# Patient Record
Sex: Female | Born: 1970 | Hispanic: No | Marital: Married | Smoking: Current every day smoker
Health system: Southern US, Community
[De-identification: ages and names within clinical notes are randomized; demographics above are authoritative.]

## PROBLEM LIST (undated history)

## (undated) DIAGNOSIS — M549 Dorsalgia, unspecified: Secondary | ICD-10-CM

---

## 1998-06-08 ENCOUNTER — Encounter: Payer: Self-pay | Admitting: Family Medicine

## 1998-06-08 ENCOUNTER — Ambulatory Visit (HOSPITAL_COMMUNITY): Admission: RE | Admit: 1998-06-08 | Discharge: 1998-06-08 | Payer: Self-pay | Admitting: Family Medicine

## 2011-11-15 ENCOUNTER — Encounter (HOSPITAL_BASED_OUTPATIENT_CLINIC_OR_DEPARTMENT_OTHER): Payer: Self-pay | Admitting: *Deleted

## 2011-11-15 ENCOUNTER — Emergency Department (HOSPITAL_BASED_OUTPATIENT_CLINIC_OR_DEPARTMENT_OTHER)
Admission: EM | Admit: 2011-11-15 | Discharge: 2011-11-15 | Disposition: A | Discharge status: Home / Self Care | Payer: Self-pay | Attending: Emergency Medicine | Admitting: Emergency Medicine

## 2011-11-15 ENCOUNTER — Emergency Department (INDEPENDENT_AMBULATORY_CARE_PROVIDER_SITE_OTHER): Payer: Self-pay

## 2011-11-15 DIAGNOSIS — M545 Low back pain: Secondary | ICD-10-CM

## 2011-11-15 DIAGNOSIS — M549 Dorsalgia, unspecified: Secondary | ICD-10-CM | POA: Insufficient documentation

## 2011-11-15 DIAGNOSIS — F17200 Nicotine dependence, unspecified, uncomplicated: Secondary | ICD-10-CM | POA: Insufficient documentation

## 2011-11-15 MED ORDER — HYDROCODONE-ACETAMINOPHEN 5-500 MG PO TABS: 1.0000 | ORAL_TABLET | Freq: Four times a day (QID) | ORAL | Status: AC | PRN

## 2011-11-15 MED ORDER — DIAZEPAM 5 MG PO TABS: 5.0000 mg | ORAL_TABLET | Freq: Three times a day (TID) | ORAL | Status: AC | PRN

## 2011-11-15 MED ORDER — IBUPROFEN 800 MG PO TABS
800.0000 mg | ORAL_TABLET | Freq: Once | ORAL | Status: AC
  Administered 2011-11-15: 800 mg via ORAL
  Filled 2011-11-15: qty 1

## 2011-11-15 MED ORDER — IBUPROFEN 800 MG PO TABS: 800.0000 mg | ORAL_TABLET | Freq: Three times a day (TID) | ORAL | Status: AC | PRN

## 2011-11-15 MED ORDER — DIAZEPAM 5 MG PO TABS
5.0000 mg | ORAL_TABLET | Freq: Once | ORAL | Status: AC
  Administered 2011-11-15: 5 mg via ORAL
  Filled 2011-11-15: qty 1

## 2011-11-15 NOTE — Discharge Instructions (Signed)
Back Pain, Adult Low back pain is very common. About 1 in 5 people have back pain.The cause of low back pain is rarely dangerous. The pain often gets better over time.About half of people with a sudden onset of back pain feel better in just 2 weeks. About 8 in 10 people feel better by 6 weeks.  CAUSES Some common causes of back pain include:  Strain of the muscles or ligaments supporting the spine.   Wear and tear (degeneration) of the spinal discs.   Arthritis.   Direct injury to the back.  DIAGNOSIS Most of the time, the direct cause of low back pain is not known.However, back pain can be treated effectively even when the exact cause of the pain is unknown.Answering your caregiver's questions about your overall health and symptoms is one of the most accurate ways to make sure the cause of your pain is not dangerous. If your caregiver needs more information, he or she may order lab work or imaging tests (X-rays or MRIs).However, even if imaging tests show changes in your back, this usually does not require surgery. HOME CARE INSTRUCTIONS For many people, back pain returns.Since low back pain is rarely dangerous, it is often a condition that people can learn to manageon their own.   Remain active. It is stressful on the back to sit or stand in one place. Do not sit, drive, or stand in one place for more than 30 minutes at a time. Take short walks on level surfaces as soon as pain allows.Try to increase the length of time you walk each day.   Do not stay in bed.Resting more than 1 or 2 days can delay your recovery.   Do not avoid exercise or work.Your body is made to move.It is not dangerous to be active, even though your back may hurt.Your back will likely heal faster if you return to being active before your pain is gone.   Pay attention to your body when you bend and lift. Many people have less discomfortwhen lifting if they bend their knees, keep the load close to their  bodies,and avoid twisting. Often, the most comfortable positions are those that put less stress on your recovering back.   Find a comfortable position to sleep. Use a firm mattress and lie on your side with your knees slightly bent. If you lie on your back, put a pillow under your knees.   Only take over-the-counter or prescription medicines as directed by your caregiver. Over-the-counter medicines to reduce pain and inflammation are often the most helpful.Your caregiver may prescribe muscle relaxant drugs.These medicines help dull your pain so you can more quickly return to your normal activities and healthy exercise.   Put ice on the injured area.   Put ice in a plastic bag.   Place a towel between your skin and the bag.   Leave the ice on for 15 to 20 minutes, 3 to 4 times a day for the first 2 to 3 days. After that, ice and heat may be alternated to reduce pain and spasms.   Ask your caregiver about trying back exercises and gentle massage. This may be of some benefit.   Avoid feeling anxious or stressed.Stress increases muscle tension and can worsen back pain.It is important to recognize when you are anxious or stressed and learn ways to manage it.Exercise is a great option.  SEEK MEDICAL CARE IF:  You have pain that is not relieved with rest or medicine.   You have   pain that does not improve in 1 week.   You have new symptoms.   You are generally not feeling well.  SEEK IMMEDIATE MEDICAL CARE IF:   You have pain that radiates from your back into your legs.   You develop new bowel or bladder control problems.   You have unusual weakness or numbness in your arms or legs.   You develop nausea or vomiting.   You develop abdominal pain.   You feel faint.  Document Released: 09/02/2005 Document Revised: 05/15/2011 Document Reviewed: 01/21/2011 [employer] Patient Information 2012 [employer].Back Exercises Back exercises help treat and prevent back injuries. The goal  of back exercises is to increase the strength of your abdominal and back muscles and the flexibility of your back. These exercises should be started when you no longer have back pain. Back exercises include:  Pelvic Tilt. Lie on your back with your knees bent. Tilt your pelvis until the lower part of your back is against the floor. Hold this position 5 to 10 sec and repeat 5 to 10 times.   Knee to Chest. Pull first 1 knee up against your chest and hold for 20 to 30 seconds, repeat this with the other knee, and then both knees. This may be done with the other leg straight or bent, whichever feels better.   Sit-Ups or Curl-Ups. Bend your knees 90 degrees. Start with tilting your pelvis, and do a partial, slow sit-up, lifting your trunk only 30 to 45 degrees off the floor. Take at least 2 to 3 seconds for each sit-up. Do not do sit-ups with your knees out straight. If partial sit-ups are difficult, simply do the above but with only tightening your abdominal muscles and holding it as directed.   Hip-Lift. Lie on your back with your knees flexed 90 degrees. Push down with your feet and shoulders as you raise your hips a couple inches off the floor; hold for 10 seconds, repeat 5 to 10 times.   Back arches. Lie on your stomach, propping yourself up on bent elbows. Slowly press on your hands, causing an arch in your low back. Repeat 3 to 5 times. Any initial stiffness and discomfort should lessen with repetition over time.   Shoulder-Lifts. Lie face down with arms beside your body. Keep hips and torso pressed to floor as you slowly lift your head and shoulders off the floor.  Do not overdo your exercises, especially in the beginning. Exercises may cause you some mild back discomfort which lasts for a few minutes; however, if the pain is more severe, or lasts for more than 15 minutes, do not continue exercises until you see your caregiver. Improvement with exercise therapy for back problems is slow.  See your  caregivers for assistance with developing a proper back exercise program. Document Released: 10/10/2004 Document Revised: 05/01/2011 Document Reviewed: 09/02/2005 [employer] Patient Information 2012 [employer]. 

## 2011-11-15 NOTE — ED Provider Notes (Signed)
History     CSN: [id number]  Arrival date & time 11/15/11  1239   First MD Initiated Contact with Patient 11/15/11 1254      Chief Complaint  Patient presents with  . Back Pain    (Consider location/radiation/quality/duration/timing/severity/associated sxs/prior treatment) HPI Patient is a 41 year old female who presents today complaining of lower back pain. She reports that around this time every year she has exacerbation of low back pain. She indicates that her pain is exactly in the midline at about the level of L3. She denies any history of injury to her back. She noted that her pain began yesterday. She can't recall a particular injury. Patient says the pain is sharp and a 8/10. It is worse with ambulation and certain movements it is better with rest. She has no associated urinary symptoms. Patient denies any urinary or fecal incontinence. She denies any sever anesthesia or other neurologic symptoms. Pain does not radiate. She has no history of osteoporosis or cancer. She has no history of heavy steroid use. Patient has tried Flexeril and diclofenac that she had from her last episode. She reports that this worked well previously for her but has not worked this time. Patient denies any nausea, vomiting, fevers, or other concerning symptoms. There are no other associated or modifying factors. History reviewed. No pertinent past medical history.  History reviewed. No pertinent past surgical history.  History reviewed. No pertinent family history.  History  Substance Use Topics  . Smoking status: Current Everyday Smoker -- 0.5 packs/day  . Smokeless tobacco: Not on file  . Alcohol Use: Yes    OB History    Grav Para Term Preterm Abortions TAB SAB Ect Mult Living                  Review of Systems  Constitutional: Negative.   HENT: Negative.   Eyes: Negative.   Respiratory: Negative.   Cardiovascular: Negative.   Gastrointestinal: Negative.   Genitourinary: Negative.     Musculoskeletal: Positive for back pain.  Skin: Negative.   Neurological: Negative.   Hematological: Negative.   Psychiatric/Behavioral: Negative.   All other systems reviewed and are negative.    Allergies  Review of patient's allergies indicates no known allergies.  Home Medications   Current Outpatient Rx  Name Route Sig Dispense Refill  . FLEXERIL PO Oral Take by mouth.    . VOLTAREN PO Oral Take by mouth.    Marland Kitchen DIAZEPAM 5 MG PO TABS Oral Take 1 tablet (5 mg total) by mouth every 8 (eight) hours as needed for anxiety. 30 tablet 0  . HYDROCODONE-ACETAMINOPHEN 5-500 MG PO TABS Oral Take 1-2 tablets by mouth every 6 (six) hours as needed for pain. 15 tablet 0  . IBUPROFEN 800 MG PO TABS Oral Take 1 tablet (800 mg total) by mouth every 8 (eight) hours as needed for pain. 30 tablet 0    BP 148/92  Pulse 91  Temp(Src) 98 F (36.7 C) (Oral)  Resp 14  SpO2 99%  LMP 11/08/2011  Physical Exam  Nursing note and vitals reviewed. GEN: Well-developed, well-nourished female in no distress HEENT: Atraumatic, normocephalic. Oropharynx clear without erythema EYES: PERRLA BL, no scleral icterus. NECK: Trachea midline, no meningismus CV: regular rate and rhythm. No murmurs, rubs, or gallops PULM: No respiratory distress.  No crackles, wheezes, or rales. GI: soft, non-tender. No guarding, rebound, or tenderness. + bowel sounds  Neuro: cranial nerves 2-12 intact, no abnormalities of strength or sensation, A and  O x 3 MSK: Patient moves all 4 extremities symmetrically, no deformity, edema. Patient has point tenderness at L3 in the midline. She has no other tenderness noted. Pain is made worse with twisting movements.  Psych: no abnormality of mood   ED Course  Procedures (including critical care time)  Labs Reviewed - No data to display Dg Lumbar Spine Complete  11/15/2011  *RADIOLOGY REPORT*  Clinical Data: Low back pain.  LUMBAR SPINE - COMPLETE 4+ VIEW  Comparison: None.  Findings:  There are five lumbar-type vertebral bodies.  No fracture or malalignment.  Disc spaces well maintained.  SI joints are symmetric.  IMPRESSION: Unremarkable study.  Original Report Authenticated By: Cyndie Chime, M.D.     1. Back pain       MDM  Patient was evaluated by myself. Based on evaluation patient did have a plain film performed as her pain was distinctly in the midline. Patient had a history of smoking but no other concerning risk factors for a bone lesion. She was hemodynamically stable with no neurologic signs. Plain film returned negative. At treated patient conservatively here with Valium as well as ibuprofen for symptoms. Patient had some improvement. As Flexeril was no longer working for her we did give her prescription for Valium as well as ibuprofen. Patient was also given a few tabs of Vicodin as she is unable to sleep at night. The progression of how these medications are to be used as discussed. Patient was discharged in good condition with work note.        Cyndra Numbers, MD 11/15/11 [phone]

## 2011-11-15 NOTE — ED Notes (Signed)
Woke yesterday with lower back pain. Thinks she pulled a muscle. Pain with ambulation.

## 2012-07-02 ENCOUNTER — Emergency Department (HOSPITAL_BASED_OUTPATIENT_CLINIC_OR_DEPARTMENT_OTHER)
Admission: EM | Admit: 2012-07-02 | Discharge: 2012-07-02 | Disposition: A | Discharge status: Home / Self Care | Payer: Self-pay | Attending: Emergency Medicine | Admitting: Emergency Medicine

## 2012-07-02 ENCOUNTER — Encounter (HOSPITAL_BASED_OUTPATIENT_CLINIC_OR_DEPARTMENT_OTHER): Payer: Self-pay | Admitting: *Deleted

## 2012-07-02 DIAGNOSIS — M545 Low back pain, unspecified: Secondary | ICD-10-CM | POA: Insufficient documentation

## 2012-07-02 DIAGNOSIS — F17200 Nicotine dependence, unspecified, uncomplicated: Secondary | ICD-10-CM | POA: Insufficient documentation

## 2012-07-02 MED ORDER — DICLOFENAC SODIUM 75 MG PO TBEC: 75.0000 mg | DELAYED_RELEASE_TABLET | Freq: Two times a day (BID) | ORAL | Status: AC

## 2012-07-02 MED ORDER — DICLOFENAC SODIUM 75 MG PO TBEC
75.0000 mg | DELAYED_RELEASE_TABLET | Freq: Once | ORAL | Status: DC
  Filled 2012-07-02: qty 1

## 2012-07-02 MED ORDER — BUPIVACAINE HCL (PF) 0.5 % IJ SOLN
10.0000 mL | Freq: Once | INTRAMUSCULAR | Status: AC
Start: 2012-07-02 — End: 2012-07-02
  Administered 2012-07-02: 10 mL
  Filled 2012-07-02: qty 10

## 2012-07-02 MED ORDER — KETOROLAC TROMETHAMINE 30 MG/ML IJ SOLN: 30.0000 mg | Freq: Once | INTRAMUSCULAR | Status: DC

## 2012-07-02 MED ORDER — CYCLOBENZAPRINE HCL 10 MG PO TABS
10.0000 mg | ORAL_TABLET | Freq: Once | ORAL | Status: AC
  Administered 2012-07-02: 10 mg via ORAL
  Filled 2012-07-02: qty 1

## 2012-07-02 MED ORDER — CYCLOBENZAPRINE HCL 10 MG PO TABS: 10.0000 mg | ORAL_TABLET | Freq: Two times a day (BID) | ORAL | Status: AC | PRN

## 2012-07-02 MED ORDER — KETOROLAC TROMETHAMINE 30 MG/ML IJ SOLN
30.0000 mg | Freq: Once | INTRAMUSCULAR | Status: AC
  Administered 2012-07-02: 30 mg via INTRAMUSCULAR
  Filled 2012-07-02: qty 1

## 2012-07-02 NOTE — ED Provider Notes (Signed)
History     CSN: [id number]  Arrival date & time 07/02/12  1237   First MD Initiated Contact with Patient 07/02/12 1321      Chief Complaint  Patient presents with  . 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 This started Friday as a "pinch" and was very minor pain however has progressed today to the point of severe pain and spasm. Pain is in the right lower lumbar region, associated with chills to the right hamstring, and into the right but off. She has no numbness or tingling in the right leg, no loss of bowel or bladder function. She's been taking ibuprofen with some improvement since Friday. She denies any IV drug abuse, no fevers or chills, no weight loss.  She has had pain similar to this in the past last March when she was treated for a similar condition. The pain has not kept her awake at night.  History reviewed. No pertinent past medical history.  History reviewed. No pertinent past surgical history.  No family history on file.  History  Substance Use Topics  . Smoking status: Current Every Day Smoker -- 0.5 packs/day    Types: Cigarettes  . Smokeless tobacco: Not on file  . Alcohol Use: Yes    OB History    Grav Para Term Preterm Abortions TAB SAB Ect Mult Living                  Review of Systems At least 10pt or greater review of systems completed and are negative except where specified in the HPI.  Allergies  Review of patient's allergies indicates no known allergies.  Home Medications   Current Outpatient Rx  Name Route Sig Dispense Refill  . FLEXERIL PO Oral Take by mouth.    . VOLTAREN PO Oral Take by mouth.      BP 144/93  Pulse 80  Temp 98.3 F (36.8 C) (Oral)  Resp 18  Ht 5\' 3"  (1.6 m)  Wt 140 lb (63.504 kg)  BMI 24.80 kg/m2   SpO2 100%  LMP 06/22/2012  Physical Exam  Nursing notes reviewed.  Electronic medical record reviewed. VITAL SIGNS:   Filed Vitals:   07/02/12 1252  BP: 144/93  Pulse: 80  Temp: 98.3 F (36.8 C)  TempSrc: Oral  Resp: 18  Height: 5\' 3"  (1.6 m)  Weight: 140 lb (63.504 kg)  SpO2: 100%   CONSTITUTIONAL: Awake, oriented, appears non-toxic HENT: Atraumatic, normocephalic, oral mucosa pink and moist, airway patent. Nares patent without drainage. External ears normal. EYES: Conjunctiva clear, EOMI, PERRLA NECK: Trachea midline, non-tender, supple CARDIOVASCULAR: Normal heart rate, Normal rhythm, No murmurs, rubs, gallops PULMONARY/CHEST: Clear to auscultation, no rhonchi, wheezes, or rales. Symmetrical breath sounds. Non-tender. ABDOMINAL: Non-distended, soft, non-tender - no rebound or guarding.  BS normal. NEUROLOGIC: Non-focal, moving all four extremities, no gross sensory or motor deficits. BACK: No midline spinal tenderness. Patient has palpable spasm in the right lower lumbar region L3-L5 and is tender to palpation at the iliac crest and into the buttock. Straight leg test  EXTREMITIES: No clubbing, cyanosis, or edema SKIN: Warm, Dry, No erythema, No rash  ED Course  Procedures (including critical care time)  Labs Reviewed - No data to display No results found.   1. Low back pain  MDM  Nichole Barton is a 41 y.o. female presenting with recurrent low back pain. Do not think there is any indication to image this patient at this time. She had x-rays performed in March which were unremarkable. 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 Patient has no signs symptoms consistent with a cord compression syndrome. We'll treat her conservatively with oral anti-inflammatories and muscle relaxers to control her muscle spasm and pain.   I explained the diagnosis and have given explicit precautions to return to the ER  including numbness tingling, weakness, perianal anesthesia or bowel or bladder incontinence or any other new or worsening symptoms. The patient understands and accepts the medical plan as it's been dictated and I have answered their questions. Discharge instructions concerning home care and prescriptions have been given.  The patient is STABLE and is discharged to home in good condition.        Jones Skene, MD 07/03/12 [phone]

## 2012-07-02 NOTE — ED Notes (Signed)
Patient states she developed mid lower back pain one week ago.  No known injury.  States pain has progressively gotten worse, describes as burning and is radiating into right buttock and lateral right leg to the knee.

## 2012-08-06 ENCOUNTER — Emergency Department (HOSPITAL_BASED_OUTPATIENT_CLINIC_OR_DEPARTMENT_OTHER)
Admission: EM | Admit: 2012-08-06 | Discharge: 2012-08-06 | Disposition: A | Discharge status: Home / Self Care | Payer: Self-pay | Attending: Emergency Medicine | Admitting: Emergency Medicine

## 2012-08-06 ENCOUNTER — Encounter (HOSPITAL_BASED_OUTPATIENT_CLINIC_OR_DEPARTMENT_OTHER): Payer: Self-pay | Admitting: *Deleted

## 2012-08-06 DIAGNOSIS — Z79899 Other long term (current) drug therapy: Secondary | ICD-10-CM | POA: Insufficient documentation

## 2012-08-06 DIAGNOSIS — F17200 Nicotine dependence, unspecified, uncomplicated: Secondary | ICD-10-CM | POA: Insufficient documentation

## 2012-08-06 DIAGNOSIS — M545 Low back pain: Secondary | ICD-10-CM

## 2012-08-06 DIAGNOSIS — M5430 Sciatica, unspecified side: Secondary | ICD-10-CM | POA: Insufficient documentation

## 2012-08-06 HISTORY — DX: Dorsalgia, unspecified: M54.9

## 2012-08-06 MED ORDER — PREDNISONE 10 MG PO TABS: ORAL_TABLET | ORAL | Status: AC

## 2012-08-06 MED ORDER — HYDROCODONE-ACETAMINOPHEN 5-325 MG PO TABS: 2.0000 | ORAL_TABLET | ORAL | Status: AC | PRN

## 2012-08-06 NOTE — ED Provider Notes (Signed)
History     CSN: [id number]  Arrival date & time 08/06/12  1135   First MD Initiated Contact with Patient 08/06/12 1222      Chief Complaint  Patient presents with  . Back Pain    (Consider location/radiation/quality/duration/timing/severity/associated sxs/prior treatment) Patient is a 41 y.o. female presenting with back pain. The history is provided by the patient. No language interpreter was used.  Back Pain  This is a new problem. Episode onset: 3 days. The problem occurs constantly. The problem has been gradually worsening. The pain is associated with no known injury. The pain is present in the lumbar spine. The quality of the pain is described as aching. The pain radiates to the right thigh. The pain is moderate. The pain is worse during the day. Stiffness is present all day. She has tried nothing for the symptoms. The treatment provided no relief.  Pt complains of low back pain that radiates down her left leg  Past Medical History  Diagnosis Date  . Back pain     History reviewed. No pertinent past surgical history.  No family history on file.  History  Substance Use Topics  . Smoking status: Current Every Day Smoker -- 0.5 packs/day    Types: Cigarettes  . Smokeless tobacco: Not on file  . Alcohol Use: Yes    OB History    Grav Para Term Preterm Abortions TAB SAB Ect Mult Living                  Review of Systems  Musculoskeletal: Positive for back pain.  All other systems reviewed and are negative.    Allergies  Review of patient's allergies indicates no known allergies.  Home Medications   Current Outpatient Rx  Name  Route  Sig  Dispense  Refill  . IBUPROFEN 800 MG PO TABS   Oral   Take 800 mg by mouth every 8 (eight) hours as needed.         . CYCLOBENZAPRINE HCL 10 MG PO TABS   Oral   Take 1 tablet (10 mg total) by mouth 2 (two) times daily as needed for muscle spasms.   20 tablet   0   . FLEXERIL PO   Oral   Take by mouth.         . DICLOFENAC SODIUM 75 MG PO TBEC   Oral   Take 1 tablet (75 mg total) by mouth 2 (two) times daily.   15 tablet   0   . VOLTAREN PO   Oral   Take by mouth.           BP 148/96  Pulse 91  Temp 98.9 F (37.2 C) (Oral)  Resp 16  Ht 5\' 2"  (1.575 m)  Wt 135 lb (61.236 kg)  BMI 24.69 kg/m2  SpO2 100%  LMP 08/05/2012  Physical Exam  Nursing note and vitals reviewed. Constitutional: She is oriented to person, place, and time. She appears well-developed and well-nourished.  HENT:  Head: Normocephalic and atraumatic.  Eyes: Pupils are equal, round, and reactive to light.  Neck: Normal range of motion. Neck supple.  Cardiovascular: Normal rate and normal heart sounds.   Pulmonary/Chest: Effort normal and breath sounds normal.  Musculoskeletal: Normal range of motion.       Tender ls spine,  Tender down into left buttock  Neurological: She is alert and oriented to person, place, and time.  Skin: Skin is warm.  Psychiatric: She has a normal mood and  affect.    ED Course  Procedures (including critical care time)  Labs Reviewed - No data to display No results found.   No diagnosis found.    MDM  Pt given rx for prednisone and hydrocodone.  Pt advised to follow up with Dr. Pearletha Forge for recheck        Elson Areas, [place] 08/06/12 1302

## 2012-08-06 NOTE — ED Provider Notes (Signed)
Medical screening examination/treatment/procedure(s) were performed by non-physician practitioner and as supervising physician I was immediately available for consultation/collaboration.   Jaisean Monteforte, MD 08/06/12 1526 

## 2012-08-06 NOTE — ED Notes (Signed)
Patient states she developed low back pain radiating into her buttocks for the last 3 days.  No known injury.  Hx of same.  Using OTC ibuprofen with minimal relief.

## 2012-11-03 ENCOUNTER — Encounter (HOSPITAL_BASED_OUTPATIENT_CLINIC_OR_DEPARTMENT_OTHER): Payer: Self-pay | Admitting: *Deleted

## 2012-11-03 ENCOUNTER — Emergency Department (HOSPITAL_BASED_OUTPATIENT_CLINIC_OR_DEPARTMENT_OTHER)
Admission: EM | Admit: 2012-11-03 | Discharge: 2012-11-03 | Disposition: A | Discharge status: Home / Self Care | Payer: Self-pay | Attending: Emergency Medicine | Admitting: Emergency Medicine

## 2012-11-03 DIAGNOSIS — R059 Cough, unspecified: Secondary | ICD-10-CM | POA: Insufficient documentation

## 2012-11-03 DIAGNOSIS — Z3202 Encounter for pregnancy test, result negative: Secondary | ICD-10-CM | POA: Insufficient documentation

## 2012-11-03 DIAGNOSIS — F17200 Nicotine dependence, unspecified, uncomplicated: Secondary | ICD-10-CM | POA: Insufficient documentation

## 2012-11-03 DIAGNOSIS — Z8739 Personal history of other diseases of the musculoskeletal system and connective tissue: Secondary | ICD-10-CM | POA: Insufficient documentation

## 2012-11-03 DIAGNOSIS — R05 Cough: Secondary | ICD-10-CM | POA: Insufficient documentation

## 2012-11-03 DIAGNOSIS — IMO0001 Reserved for inherently not codable concepts without codable children: Secondary | ICD-10-CM | POA: Insufficient documentation

## 2012-11-03 DIAGNOSIS — J029 Acute pharyngitis, unspecified: Secondary | ICD-10-CM | POA: Insufficient documentation

## 2012-11-03 DIAGNOSIS — J111 Influenza due to unidentified influenza virus with other respiratory manifestations: Secondary | ICD-10-CM | POA: Insufficient documentation

## 2012-11-03 LAB — URINE MICROSCOPIC-ADD ON

## 2012-11-03 LAB — URINALYSIS, ROUTINE W REFLEX MICROSCOPIC
Leukocytes, UA: NEGATIVE
Nitrite: NEGATIVE
Protein, ur: NEGATIVE mg/dL
Specific Gravity, Urine: 1.017 (ref 1.005–1.030)
Urobilinogen, UA: 1 mg/dL (ref 0.0–1.0)

## 2012-11-03 LAB — PREGNANCY, URINE: Preg Test, Ur: NEGATIVE

## 2012-11-03 LAB — RAPID STREP SCREEN (MED CTR MEBANE ONLY): Streptococcus, Group A Screen (Direct): NEGATIVE

## 2012-11-03 MED ORDER — ACETAMINOPHEN 325 MG PO TABS
650.0000 mg | ORAL_TABLET | Freq: Once | ORAL | Status: AC
  Administered 2012-11-03: 650 mg via ORAL
  Filled 2012-11-03: qty 2

## 2012-11-03 NOTE — ED Notes (Signed)
Fever x 2 days. Sore throat and cough.

## 2012-11-03 NOTE — ED Provider Notes (Signed)
History     CSN: [id number]  Arrival date & time 11/03/12  1648   First MD Initiated Contact with Patient 11/03/12 1700      Chief Complaint  Patient presents with  . Fever    (Consider location/radiation/quality/duration/timing/severity/associated sxs/prior treatment) Patient is a 42 y.o. female presenting with fever. The history is provided by the patient.  Fever Max temp prior to arrival:  103 Temp source:  Oral Severity:  Moderate Onset quality:  Gradual Duration:  2 days Timing:  Constant Progression:  Worsening Chronicity:  New Relieved by:  Acetaminophen Worsened by:  Nothing tried Associated symptoms: chills, cough, myalgias and sore throat   Associated symptoms: no chest pain, no diarrhea, no nausea, no rhinorrhea and no vomiting   Risk factors: sick contacts   Risk factors: no hx of cancer and no immunosuppression     Past Medical History  Diagnosis Date  . Back pain     History reviewed. No pertinent past surgical history.  No family history on file.  History  Substance Use Topics  . Smoking status: Current Every Day Smoker -- 0.50 packs/day    Types: Cigarettes  . Smokeless tobacco: Not on file  . Alcohol Use: Yes    OB History   Grav Para Term Preterm Abortions TAB SAB Ect Mult Living                  Review of Systems  Constitutional: Positive for fever and chills.  HENT: Positive for sore throat. Negative for rhinorrhea.   Respiratory: Positive for cough.   Cardiovascular: Negative for chest pain.  Gastrointestinal: Negative for nausea, vomiting and diarrhea.  Musculoskeletal: Positive for myalgias.  All other systems reviewed and are negative.    Allergies  Review of patient's allergies indicates no known allergies.  Home Medications   Current Outpatient Rx  Name  Route  Sig  Dispense  Refill  . cyclobenzaprine (FLEXERIL) 10 MG tablet   Oral   Take 1 tablet (10 mg total) by mouth 2 (two) times daily as needed for muscle  spasms.   20 tablet   0   . Cyclobenzaprine HCl (FLEXERIL PO)   Oral   Take by mouth.         . diclofenac (VOLTAREN) 75 MG EC tablet   Oral   Take 1 tablet (75 mg total) by mouth 2 (two) times daily.   15 tablet   0   . Diclofenac Sodium (VOLTAREN PO)   Oral   Take by mouth.         Marland Kitchen HYDROcodone-acetaminophen (NORCO/VICODIN) 5-325 MG per tablet   Oral   Take 2 tablets by mouth every 4 (four) hours as needed for pain.   10 tablet   0   . ibuprofen (ADVIL,MOTRIN) 800 MG tablet   Oral   Take 800 mg by mouth every 8 (eight) hours as needed.         . predniSONE (DELTASONE) 10 MG tablet      6,5,4,3,2,1 taper   21 tablet   0     BP 114/81  Pulse 120  Temp(Src) 100.4 F (38 C) (Oral)  Resp 22  SpO2 98%  LMP 10/28/2012  Physical Exam  Nursing note and vitals reviewed. Constitutional: She is oriented to person, place, and time. She appears well-developed and well-nourished. No distress.  HENT:  Head: Normocephalic and atraumatic.  Mouth/Throat: Mucous membranes are normal. Posterior oropharyngeal erythema present. No oropharyngeal exudate or posterior oropharyngeal edema.  Eyes: Conjunctivae and EOM are normal. Pupils are equal, round, and reactive to light.  Neck: Normal range of motion. Neck supple.  Cardiovascular: Regular rhythm and intact distal pulses.  Tachycardia present.   No murmur heard. Pulmonary/Chest: Effort normal and breath sounds normal. No respiratory distress. She has no wheezes. She has no rales.  Abdominal: Soft. She exhibits no distension. There is no tenderness. There is no rebound and no guarding.  Musculoskeletal: Normal range of motion. She exhibits no edema and no tenderness.  Lymphadenopathy:    She has cervical adenopathy.  Neurological: She is alert and oriented to person, place, and time.  Skin: Skin is warm and dry. No rash noted. No erythema.  Psychiatric: She has a normal mood and affect. Her behavior is normal.    ED  Course  Procedures (including critical care time)  Labs Reviewed  URINALYSIS, ROUTINE W REFLEX MICROSCOPIC - Abnormal; Notable for the following:    APPearance CLOUDY (*)    Hgb urine dipstick MODERATE (*)    All other components within normal limits  URINE MICROSCOPIC-ADD ON - Abnormal; Notable for the following:    Squamous Epithelial / LPF FEW (*)    All other components within normal limits  RAPID STREP SCREEN  PREGNANCY, URINE   No results found.   No diagnosis found.    MDM   Pt with symptoms consistent with influenza.  Normal exam here but is febrile.  No signs of breathing difficulty  No signs of strep pharyngitis, otitis or abnormal abdominal findings.   rapid strep wnl.  Will continue antipyretica and rest and fluids and return for any further problems.         Gwyneth Sprout, MD 11/03/12 [phone]

## 2014-02-27 IMAGING — CR DG LUMBAR SPINE COMPLETE 4+V
5 series · 5 of 5 positions shown · non-contrast
Comparison: None.

CLINICAL DATA: Low back pain.

LUMBAR SPINE - COMPLETE 4+ VIEW

[t l-spine a.p.]
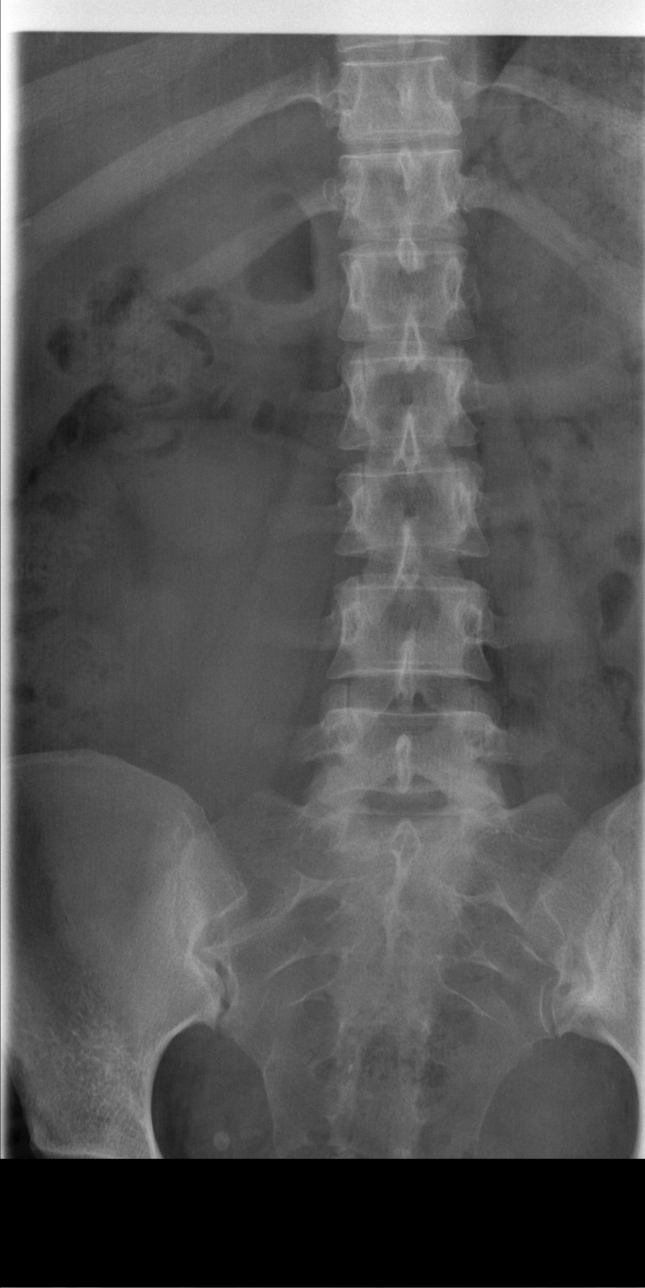

[t l-spine oblique exposure (1 of 2)]
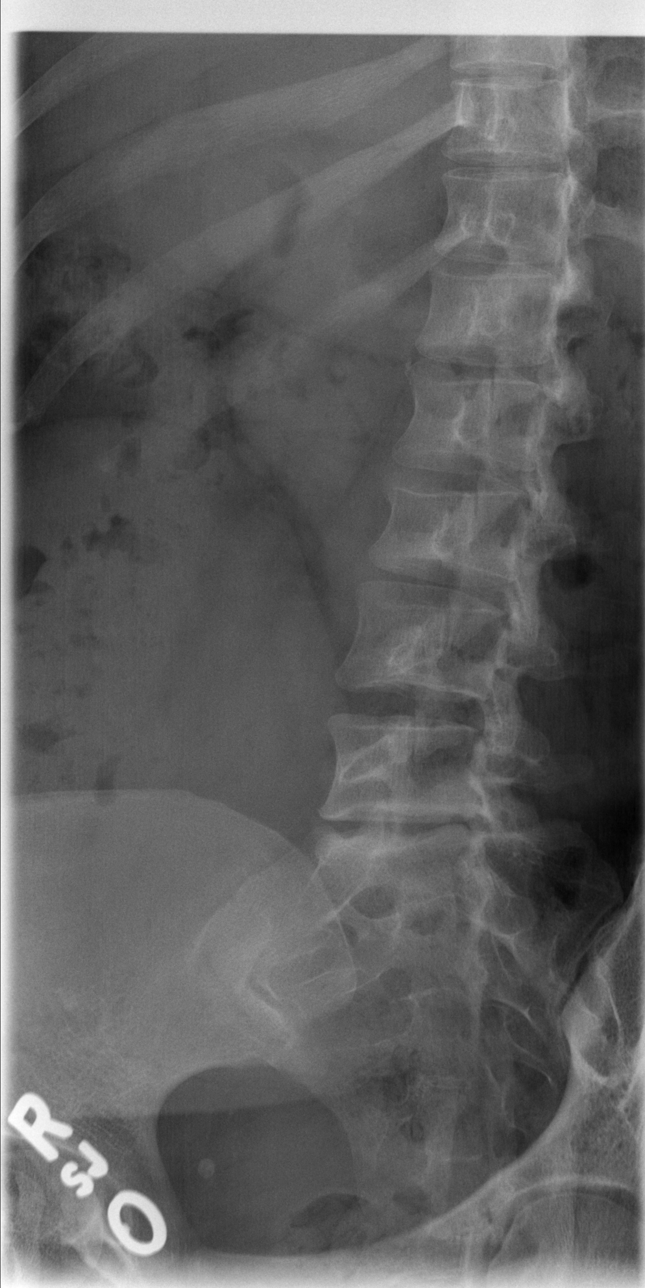

[t l-spine oblique exposure (2 of 2)]
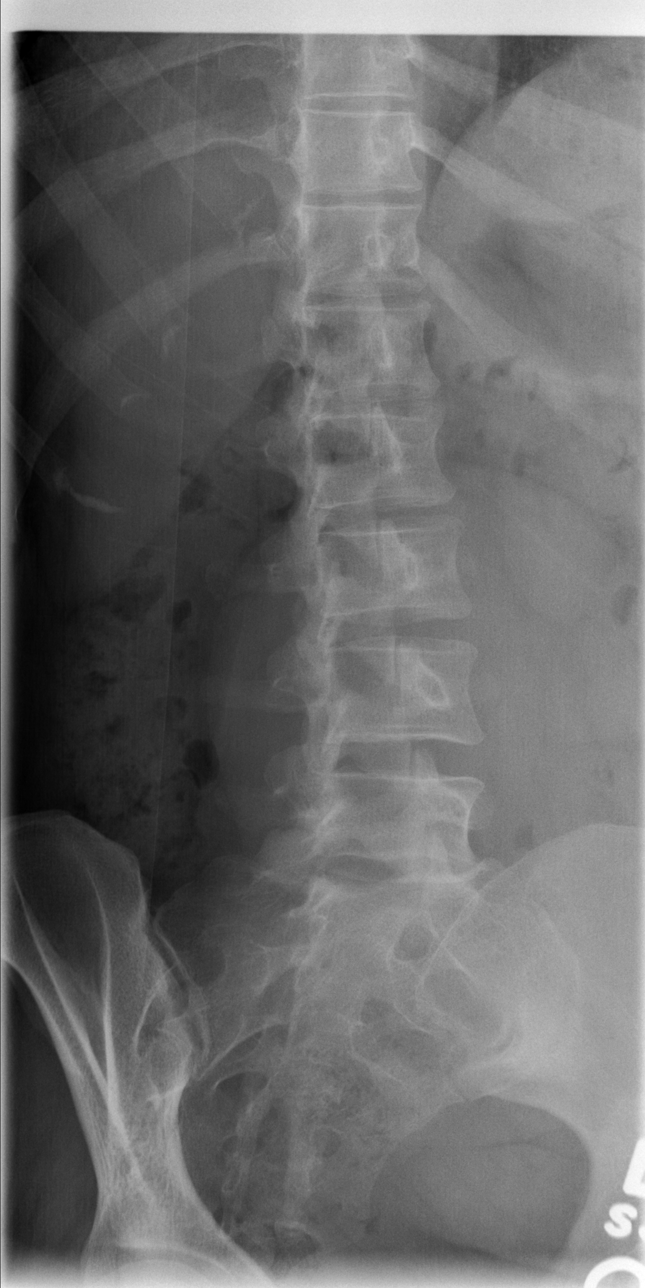

[t l-spine lat]
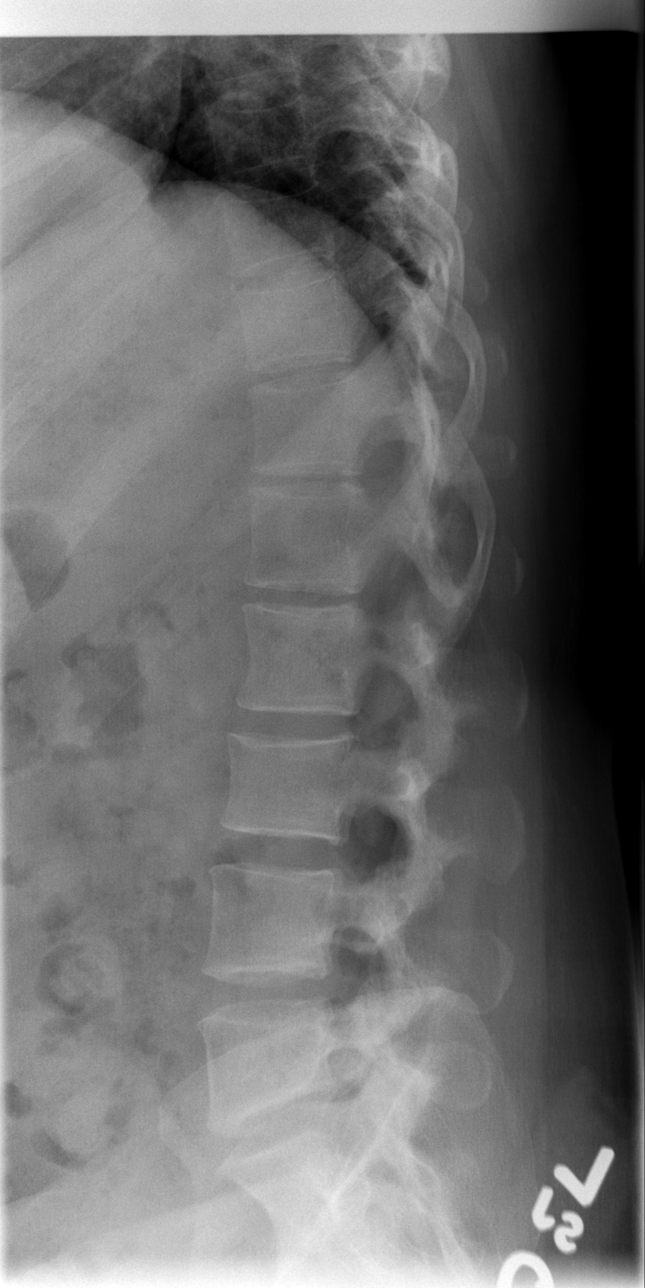

[t l-spine l5-s1 spot]
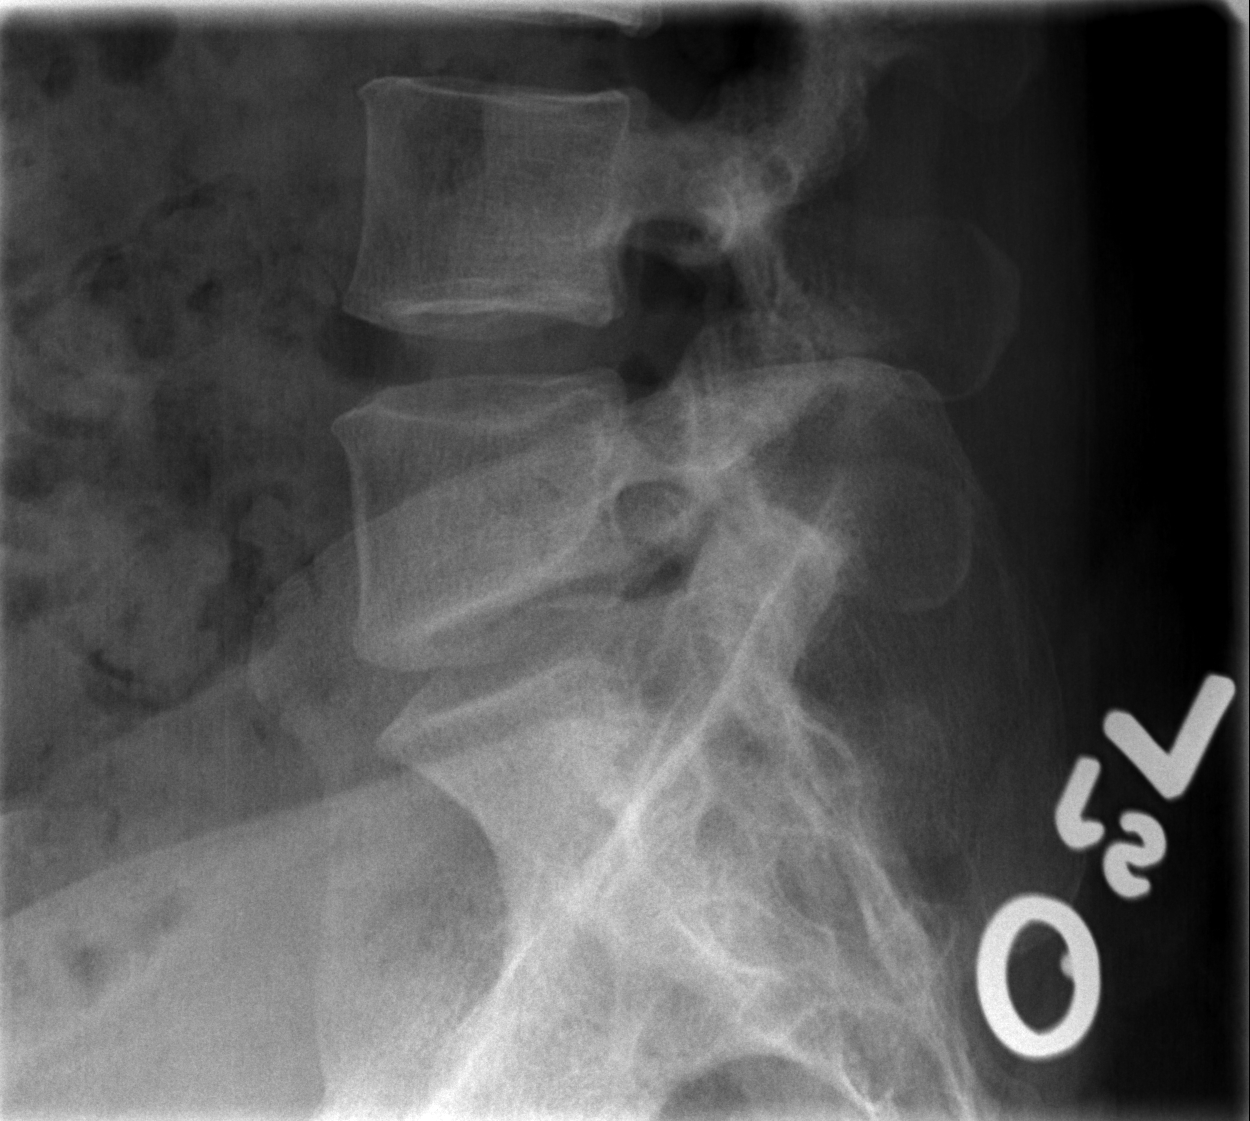

[5 of 5 positions shown; findings below may reference images not displayed]

FINDINGS: There are five lumbar-type vertebral bodies.  No fracture
or malalignment.  Disc spaces well maintained.  SI joints are
symmetric.
IMPRESSION: Unremarkable study.

## 2016-01-28 ENCOUNTER — Encounter (HOSPITAL_BASED_OUTPATIENT_CLINIC_OR_DEPARTMENT_OTHER): Payer: Self-pay | Admitting: Emergency Medicine

## 2016-01-28 ENCOUNTER — Emergency Department (HOSPITAL_BASED_OUTPATIENT_CLINIC_OR_DEPARTMENT_OTHER)
Admission: EM | Admit: 2016-01-28 | Discharge: 2016-01-28 | Disposition: A | Discharge status: Home / Self Care | Payer: BLUE CROSS/BLUE SHIELD | Attending: Emergency Medicine | Admitting: Emergency Medicine

## 2016-01-28 DIAGNOSIS — K0889 Other specified disorders of teeth and supporting structures: Secondary | ICD-10-CM

## 2016-01-28 DIAGNOSIS — F17210 Nicotine dependence, cigarettes, uncomplicated: Secondary | ICD-10-CM | POA: Diagnosis not present

## 2016-01-28 MED ORDER — IBUPROFEN 800 MG PO TABS: 800.0000 mg | ORAL_TABLET | Freq: Three times a day (TID) | ORAL | Status: AC | PRN

## 2016-01-28 MED ORDER — PENICILLIN V POTASSIUM 500 MG PO TABS: 500.0000 mg | ORAL_TABLET | Freq: Four times a day (QID) | ORAL | Status: AC

## 2016-01-28 MED ORDER — BUPIVACAINE-EPINEPHRINE (PF) 0.5% -1:200000 IJ SOLN
1.8000 mL | Freq: Once | INTRAMUSCULAR | Status: AC
  Administered 2016-01-28: 1.8 mL
  Filled 2016-01-28: qty 1.8

## 2016-01-28 MED ORDER — ACETAMINOPHEN 500 MG PO TABS: 1000.0000 mg | ORAL_TABLET | Freq: Four times a day (QID) | ORAL | Status: AC | PRN

## 2016-01-28 MED ORDER — OXYCODONE HCL 5 MG PO TABS
5.0000 mg | ORAL_TABLET | Freq: Once | ORAL | Status: AC
  Administered 2016-01-28: 5 mg via ORAL
  Filled 2016-01-28: qty 1

## 2016-01-28 MED ORDER — PENICILLIN V POTASSIUM 250 MG PO TABS
250.0000 mg | ORAL_TABLET | Freq: Once | ORAL | Status: AC
  Administered 2016-01-28: 250 mg via ORAL
  Filled 2016-01-28: qty 1

## 2016-01-28 MED ORDER — IBUPROFEN 800 MG PO TABS
800.0000 mg | ORAL_TABLET | Freq: Once | ORAL | Status: AC
  Administered 2016-01-28: 800 mg via ORAL
  Filled 2016-01-28: qty 1

## 2016-01-28 NOTE — ED Notes (Signed)
Pt in with RL dental pain and swelling onset 2 days ago. Has dental appt May 24.

## 2016-01-28 NOTE — ED Notes (Signed)
Pt took 1g Tylenol PTA.

## 2016-01-28 NOTE — ED Provider Notes (Signed)
CSN: [id number]     Arrival date & time 01/28/16  1633 History  By signing my name below, I, Bethel Born, attest that this documentation has been prepared under the direction and in the presence of Melene Plan, DO. Electronically Signed: Bethel Born, ED Scribe. 01/28/2016. 6:15 PM   Chief Complaint  Patient presents with  . Dental Pain   The history is provided by the patient. No language interpreter was used.   Nichole Barton is a 45 y.o. female who presents to the Emergency Department complaining of worsening right lower dental pain with onset 2 days ago.b Pt states that she had the same pain 2 months ago but it resolved. Tylenol has provided insufficient pain relief at home. The pain is worse with brushing and chewing on the right side.  Associated symptoms include right sided facial swelling. Pt denies fever, difficulty swallowing, nausea, vomiting. She has a scheduled dental appointment on 02/07/16.   Past Medical History  Diagnosis Date  . Back pain    History reviewed. No pertinent past surgical history. History reviewed. No pertinent family history. Social History  Substance Use Topics  . Smoking status: Current Every Day Smoker -- 0.50 packs/day    Types: Cigarettes  . Smokeless tobacco: None  . Alcohol Use: Yes   OB History    No data available     Review of Systems  Constitutional: Negative for fever and chills.  HENT: Positive for dental problem and facial swelling. Negative for congestion, drooling, rhinorrhea and trouble swallowing.   Eyes: Negative for redness and visual disturbance.  Respiratory: Negative for shortness of breath and wheezing.   Cardiovascular: Negative for chest pain and palpitations.  Gastrointestinal: Negative for nausea and vomiting.  Genitourinary: Negative for dysuria and urgency.  Musculoskeletal: Negative for myalgias and arthralgias.  Skin: Negative for pallor and wound.  Neurological: Negative for dizziness and headaches.  All  other systems reviewed and are negative.  Allergies  Review of patient's allergies indicates no known allergies.  Home Medications   Prior to Admission medications   Medication Sig Start Date End Date Taking? Authorizing Provider  acetaminophen (TYLENOL) 500 MG tablet Take 2 tablets (1,000 mg total) by mouth every 6 (six) hours as needed for moderate pain. 01/28/16   Melene Plan, DO  cyclobenzaprine (FLEXERIL) 10 MG tablet Take 1 tablet (10 mg total) by mouth 2 (two) times daily as needed for muscle spasms. 07/02/12   John-Adam Bonk, MD  Cyclobenzaprine HCl (FLEXERIL PO) Take by mouth.    Historical Provider, MD  diclofenac (VOLTAREN) 75 MG EC tablet Take 1 tablet (75 mg total) by mouth 2 (two) times daily. 07/02/12   John-Adam Bonk, MD  Diclofenac Sodium (VOLTAREN PO) Take by mouth.    Historical Provider, MD  HYDROcodone-acetaminophen (NORCO/VICODIN) 5-325 MG per tablet Take 2 tablets by mouth every 4 (four) hours as needed for pain. 08/06/12   Elson Areas, PA-C  ibuprofen (ADVIL,MOTRIN) 800 MG tablet Take 800 mg by mouth every 8 (eight) hours as needed.    Historical Provider, MD  ibuprofen (ADVIL,MOTRIN) 800 MG tablet Take 1 tablet (800 mg total) by mouth every 8 (eight) hours as needed for moderate pain. 01/28/16   Melene Plan, DO  penicillin v potassium (VEETID) 500 MG tablet Take 1 tablet (500 mg total) by mouth 4 (four) times daily. 01/28/16 02/04/16  Melene Plan, DO  predniSONE (DELTASONE) 10 MG tablet 6,5,4,3,2,1 taper 08/06/12   Elson Areas, PA-C   BP 156/105 mmHg  Pulse 86  Temp(Src) 98.1 F (36.7 C) (Oral)  Resp 16  Ht 5\' 3"  (1.6 m)  Wt 135 lb (61.236 kg)  BMI 23.92 kg/m2  SpO2 99%  LMP 01/21/2016 Physical Exam  Constitutional: She is oriented to person, place, and time. She appears well-developed and well-nourished. No distress.  HENT:  Head: Normocephalic and atraumatic.  Localized swelling and tenderness about the first molar at the right lower jaw No trismus, drooling,  sublingual swelling, posterior pharynx erythema or edema Mild fluctuance about the gumline of that same tooth  Eyes: EOM are normal. Pupils are equal, round, and reactive to light.  Neck: Normal range of motion. Neck supple.  Cardiovascular: Normal rate and regular rhythm.  Exam reveals no gallop and no friction rub.   No murmur heard. Pulmonary/Chest: Effort normal. She has no wheezes. She has no rales.  Abdominal: Soft. She exhibits no distension. There is no tenderness.  Musculoskeletal: She exhibits no edema or tenderness.  Neurological: She is alert and oriented to person, place, and time.  Skin: Skin is warm and dry. She is not diaphoretic.  Psychiatric: She has a normal mood and affect. Her behavior is normal.  Nursing note and vitals reviewed.   ED Course  .Nerve Block Date/Time: 01/28/2016 7:09 PM Performed by: Melene Plan Authorized by: Melene Plan Consent: Verbal consent obtained. Risks and benefits: risks, benefits and alternatives were discussed Consent given by: patient Required items: required blood products, implants, devices, and special equipment available Patient identity confirmed: verbally with patient Time out: Immediately prior to procedure a "time out" was called to verify the correct patient, procedure, equipment, support staff and site/side marked as required. Indications: pain relief Body area: face/mouth Nerve: inferior alveolar Laterality: right Patient sedated: no Preparation: Patient was prepped and draped in the usual sterile fashion. Patient position: sitting Needle gauge: 27 G Location technique: anatomical landmarks Local anesthetic: bupivacaine 0.5% with epinephrine Anesthetic total: 10 ml Outcome: pain improved Patient tolerance: Patient tolerated the procedure well with no immediate complications  .Marland KitchenIncision and Drainage Date/Time: 01/28/2016 7:10 PM Performed by: Adela Lank Xenia Nile Authorized by: Melene Plan Consent: Verbal consent  obtained. Risks and benefits: risks, benefits and alternatives were discussed Consent given by: patient Required items: required blood products, implants, devices, and special equipment available Patient identity confirmed: verbally with patient Time out: Immediately prior to procedure a "time out" was called to verify the correct patient, procedure, equipment, support staff and site/side marked as required. Type: abscess Body area: mouth Location details: floor of mouth Anesthesia: local infiltration Local anesthetic: bupivacaine 0.5% with epinephrine Anesthetic total: 10 ml Needle gauge: 18 Incision type: single straight Incision depth: subcutaneous Complexity: simple Drainage: bloody Drainage amount: scant Wound treatment: wound left open Patient tolerance: Patient tolerated the procedure well with no immediate complications   (including critical care time) DIAGNOSTIC STUDIES: Oxygen Saturation is 100% on RA,  normal by my interpretation.    COORDINATION OF CARE: 6:11 PM Discussed treatment plan which includes dental block and I&D with pt at bedside and pt agreed to plan.  Labs Review Labs Reviewed - No data to display  Imaging Review No results found.    EKG Interpretation None      MDM   Final diagnoses:  Pain, dental    45 yo F With a chief complaint of dental pain. This been off and on for the past month or so but worsening over the past couple days. Denies fevers or chills. Significant swelling to the right lower aspect  of her jaw. Associated with her first molar on the right lower side. Fluctuance. I&D with small about of blood drainage.  Dental follow up.  10:53 PM:  I have discussed the diagnosis/risks/treatment options with the patient and family and believe the pt to be eligible for discharge home to follow-up with Dentist. We also discussed returning to the ED immediately if new or worsening sx occur. We discussed the sx which are most concerning (e.g.,  sudden worsening pain, fever, inability to tolerate by mouth) that necessitate immediate return. Medications administered to the patient during their visit and any new prescriptions provided to the patient are listed below.  Medications given during this visit Medications  bupivacaine-epinephrine (MARCAINE W/ EPI) 0.5% -1:200000 injection 1.8 mL (1.8 mLs Infiltration Given 01/28/16 1840)  ibuprofen (ADVIL,MOTRIN) tablet 800 mg (800 mg Oral Given 01/28/16 1826)  oxyCODONE (Oxy IR/ROXICODONE) immediate release tablet 5 mg (5 mg Oral Given 01/28/16 1826)  penicillin v potassium (VEETID) tablet 250 mg (250 mg Oral Given 01/28/16 1826)    Discharge Medication List as of 01/28/2016  7:08 PM    START taking these medications   Details  acetaminophen (TYLENOL) 500 MG tablet Take 2 tablets (1,000 mg total) by mouth every 6 (six) hours as needed for moderate pain., Starting 01/28/2016, Until Discontinued, Print    !! ibuprofen (ADVIL,MOTRIN) 800 MG tablet Take 1 tablet (800 mg total) by mouth every 8 (eight) hours as needed for moderate pain., Starting 01/28/2016, Until Discontinued, Print    penicillin v potassium (VEETID) 500 MG tablet Take 1 tablet (500 mg total) by mouth 4 (four) times daily., Starting 01/28/2016, Until Sun 02/04/16, Print     !! - Potential duplicate medications found. Please discuss with provider.      The patient appears reasonably screen and/or stabilized for discharge and I doubt any other medical condition or other [facility] requiring further screening, evaluation, or treatment in the ED at this time prior to discharge.     I personally performed the services described in this documentation, which was scribed in my presence. The recorded information has been reviewed and is accurate.    Melene Planan Arvil Utz, DO 01/28/16 2253

## 2019-11-19 ENCOUNTER — Ambulatory Visit: Payer: BLUE CROSS/BLUE SHIELD | Attending: Internal Medicine

## 2019-11-19 DIAGNOSIS — Z23 Encounter for immunization: Secondary | ICD-10-CM | POA: Insufficient documentation

## 2019-11-19 NOTE — Progress Notes (Signed)
   Covid-19 Vaccination Clinic  Name:  Simran Bomkamp    MRN: [id number] DOB: 05-08-1971  11/19/2019  Ms. Schmit was observed post Covid-19 immunization for 15 minutes without incident. She was provided with Vaccine Information Sheet and instruction to access the V-Safe system.   Ms. Macdowell was instructed to call 911 with any severe reactions post vaccine: Marland Kitchen Difficulty breathing  . Swelling of face and throat  . A fast heartbeat  . A bad rash all over body  . Dizziness and weakness   Immunizations Administered    Name Date Dose VIS Date Route   Pfizer COVID-19 Vaccine 11/19/2019  1:14 PM 0.3 mL 08/27/2019 Intramuscular   Manufacturer: [employer], [employer]   Lot: [id number]   NDC: [address]-5

## 2019-12-15 ENCOUNTER — Ambulatory Visit: Payer: BLUE CROSS/BLUE SHIELD | Attending: Internal Medicine

## 2019-12-15 DIAGNOSIS — Z23 Encounter for immunization: Secondary | ICD-10-CM

## 2019-12-15 NOTE — Progress Notes (Signed)
   Covid-19 Vaccination Clinic  Name:  Nichole Barton    MRN: [id number] DOB: 07-11-1971  12/15/2019  Nichole Barton was observed post Covid-19 immunization for 15 minutes without incident. She was provided with Vaccine Information Sheet and instruction to access the V-Safe system.   Nichole Barton was instructed to call 911 with any severe reactions post vaccine: Marland Kitchen Difficulty breathing  . Swelling of face and throat  . A fast heartbeat  . A bad rash all over body  . Dizziness and weakness   Immunizations Administered    Name Date Dose VIS Date Route   Pfizer COVID-19 Vaccine 12/15/2019  3:36 PM 0.3 mL 08/27/2019 Intramuscular   Manufacturer: [employer], [employer]   Lot: [id number]   NDC: [address]-2
